# Patient Record
Sex: Female | Born: 1990 | Race: White | Hispanic: No | Marital: Married | Smoking: Heavy tobacco smoker
Health system: Southern US, Community
[De-identification: ages and names within clinical notes are randomized; demographics above are authoritative.]

## PROBLEM LIST (undated history)

## (undated) DIAGNOSIS — T7840XA Allergy, unspecified, initial encounter: Secondary | ICD-10-CM

## (undated) HISTORY — DX: Allergy, unspecified, initial encounter: T78.40XA

---
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
## 2011-01-14 LAB — BASIC METABOLIC PANEL
BUN: 13
Calcium: 9.6
Glucose, Bld: 96
Potassium: 3.6
Sodium: 138

## 2011-01-14 LAB — CBC
HCT: 41
Hemoglobin: 14
MCHC: 34.2
MCV: 84.4
WBC: 13.9 — ABNORMAL HIGH

## 2011-01-14 LAB — LIPASE, BLOOD: Lipase: 23

## 2011-01-14 LAB — DIFFERENTIAL
Basophils Relative: 1
Eosinophils Relative: 1
Monocytes Relative: 5

## 2011-12-14 ENCOUNTER — Other Ambulatory Visit (HOSPITAL_COMMUNITY)
Admission: RE | Admit: 2011-12-14 | Discharge: 2011-12-14 | Disposition: A | Discharge status: Home / Self Care | Payer: BC Managed Care – PPO | Source: Ambulatory Visit | Attending: Family Medicine | Admitting: Family Medicine

## 2011-12-14 ENCOUNTER — Other Ambulatory Visit: Payer: Self-pay | Admitting: Family Medicine

## 2011-12-14 DIAGNOSIS — Z01419 Encounter for gynecological examination (general) (routine) without abnormal findings: Secondary | ICD-10-CM | POA: Insufficient documentation

## 2013-10-09 ENCOUNTER — Other Ambulatory Visit: Payer: Self-pay | Admitting: Family Medicine

## 2013-10-09 ENCOUNTER — Other Ambulatory Visit (HOSPITAL_COMMUNITY)
Admission: RE | Admit: 2013-10-09 | Discharge: 2013-10-09 | Disposition: A | Discharge status: Home / Self Care | Payer: PRIVATE HEALTH INSURANCE | Source: Ambulatory Visit | Attending: Family Medicine | Admitting: Family Medicine

## 2013-10-09 DIAGNOSIS — Z0000 Encounter for general adult medical examination without abnormal findings: Secondary | ICD-10-CM | POA: Insufficient documentation

## 2013-10-09 DIAGNOSIS — Z113 Encounter for screening for infections with a predominantly sexual mode of transmission: Secondary | ICD-10-CM | POA: Insufficient documentation

## 2013-10-10 LAB — CYTOLOGY - PAP

## 2014-04-27 ENCOUNTER — Ambulatory Visit (INDEPENDENT_AMBULATORY_CARE_PROVIDER_SITE_OTHER): Payer: PRIVATE HEALTH INSURANCE | Admitting: Physician Assistant

## 2014-04-27 VITALS — BP 138/82 | HR 64 | Temp 98.1°F | Resp 18 | Ht 63.5 in | Wt 254.0 lb

## 2014-04-27 DIAGNOSIS — N898 Other specified noninflammatory disorders of vagina: Secondary | ICD-10-CM

## 2014-04-27 DIAGNOSIS — N3001 Acute cystitis with hematuria: Secondary | ICD-10-CM

## 2014-04-27 DIAGNOSIS — L298 Other pruritus: Secondary | ICD-10-CM

## 2014-04-27 DIAGNOSIS — R300 Dysuria: Secondary | ICD-10-CM

## 2014-04-27 LAB — POCT UA - MICROSCOPIC ONLY
CASTS, UR, LPF, POC: NEGATIVE
Crystals, Ur, HPF, POC: NEGATIVE
YEAST UA: NEGATIVE

## 2014-04-27 LAB — POCT WET PREP WITH KOH
Clue Cells Wet Prep HPF POC: NEGATIVE
KOH PREP POC: NEGATIVE
RBC WET PREP PER HPF POC: NEGATIVE
Trichomonas, UA: NEGATIVE
Yeast Wet Prep HPF POC: NEGATIVE

## 2014-04-27 LAB — POCT URINALYSIS DIPSTICK
Bilirubin, UA: NEGATIVE
Glucose, UA: NEGATIVE
KETONES UA: NEGATIVE
Nitrite, UA: NEGATIVE
PH UA: 5
PROTEIN UA: NEGATIVE
SPEC GRAV UA: 1.025
Urobilinogen, UA: 0.2

## 2014-04-27 LAB — POCT URINE PREGNANCY: Preg Test, Ur: NEGATIVE

## 2014-04-27 MED ORDER — PHENAZOPYRIDINE HCL 200 MG PO TABS: 200.0000 mg | ORAL_TABLET | Freq: Three times a day (TID) | ORAL | Status: DC | PRN

## 2014-04-27 MED ORDER — NITROFURANTOIN MONOHYD MACRO 100 MG PO CAPS: 100.0000 mg | ORAL_CAPSULE | Freq: Two times a day (BID) | ORAL | Status: AC

## 2014-04-27 NOTE — Patient Instructions (Signed)
Drink a lot of fluids to help get the antibiotic to the bladder to treat the infection.

## 2014-04-27 NOTE — Progress Notes (Signed)
Subjective:    Patient ID: Rhonda Cobb, female    DOB: 03/26/1991, 24 y.o.   MRN: [id number]  HPI Pt presents to clinic with about 2 weeks h/o dysuria and urinary frequecy and urgency.  She started taking AZO and thought it was getting better but then she starting to get vaginal itching so she used a monistat and after that she started to get urinary symptoms again.  She is sexually active with her female finance and recently stopped using condoms because they are thinking about pregnancy.  She was put on OCP for irregular menses and for about 4 months she had regular menses and then the started to get irregular again and after 2 months of break through bleeding in November she stopped taking them and they started trying to get pregnant  She is concerned because she has not had a positive pregnancy test yet and she has not had her period since November.    OTC - AZO - slight improvement, monistat 7 day - slight help with itching,     Review of Systems  Genitourinary: Positive for dysuria, urgency, frequency and hematuria. Negative for vaginal discharge and vaginal pain.   There are no active problems to display for this patient.  Prior to Admission medications   Medication Sig Start Date End Date Taking? Authorizing Provider  none   05/02/14  Morrell Riddle, PA-C       Morrell Riddle, PA-C   No Known Allergies  Medications, allergies, past medical history, surgical history, family history, social history and problem list reviewed and updated.      Objective:   Physical Exam  Constitutional: She is oriented to person, place, and time. She appears well-developed and well-nourished.  BP 138/82 mmHg  Pulse 64  Temp(Src) 98.1 F (36.7 C) (Oral)  Resp 18  Ht 5' 3.5" (1.613 m)  Wt 254 lb (115.214 kg)  BMI 44.28 kg/m2  SpO2 98%   HENT:  Head: Normocephalic and atraumatic.  Right Ear: External ear normal.  Left Ear: External ear normal.  Eyes: Conjunctivae are normal.  Neck: Normal  range of motion.  Cardiovascular: Normal rate, regular rhythm and normal heart sounds.   No murmur heard. Pulmonary/Chest: Effort normal and breath sounds normal. She has no wheezes.  Abdominal: Soft. Bowel sounds are normal. There is tenderness in the suprapubic area. There is no rebound, no guarding, no CVA tenderness and no tenderness at McBurney's point.  Genitourinary: Vagina normal and uterus normal. Pelvic exam was performed with patient supine. No labial fusion. There is no rash, tenderness, lesion or injury on the right labia. There is no rash, tenderness, lesion or injury on the left labia. Cervix exhibits no motion tenderness, no discharge and no friability. Right adnexum displays no mass, no tenderness and no fullness. Left adnexum displays no mass, no tenderness and no fullness.  Mild erythema interior aspect of labia minor.  Neurological: She is alert and oriented to person, place, and time.  Skin: Skin is warm and dry.  Psychiatric: She has a normal mood and affect. Her behavior is normal. Judgment and thought content normal.   Results for orders placed or performed in visit on 04/27/14  POCT urine pregnancy  Result Value Ref Range   Preg Test, Ur Negative   POCT urinalysis dipstick  Result Value Ref Range   Color, UA yellow    Clarity, UA cloudy    Glucose, UA neg    Bilirubin, UA neg  Ketones, UA neg    Spec Grav, UA 1.025    Blood, UA trace    pH, UA 5.0    Protein, UA neg    Urobilinogen, UA 0.2    Nitrite, UA neg    Leukocytes, UA moderate (2+)   POCT UA - Microscopic Only  Result Value Ref Range   WBC, Ur, HPF, POC 35-40    RBC, urine, microscopic 2-4    Bacteria, U Microscopic 1+    Mucus, UA trace    Epithelial cells, urine per micros 2-4    Crystals, Ur, HPF, POC neg    Casts, Ur, LPF, POC neg    Yeast, UA neg   POCT Wet Prep with KOH  Result Value Ref Range   Trichomonas, UA Negative    Clue Cells Wet Prep HPF POC neg    Epithelial Wet Prep HPF  POC 3-5    Yeast Wet Prep HPF POC neg    Bacteria Wet Prep HPF POC 1+    RBC Wet Prep HPF POC neg    WBC Wet Prep HPF POC 2-4    KOH Prep POC Negative         Assessment & Plan:  Dysuria - Plan: POCT urine pregnancy, POCT urinalysis dipstick, POCT UA - Microscopic Only, GC/Chlamydia Probe Amp, Urine culture  Vaginal itching - Plan: POCT Wet Prep with KOH  Acute cystitis with hematuria - Plan: phenazopyridine (PYRIDIUM) 200 MG tablet, nitrofurantoin, macrocrystal-monohydrate, (MACROBID) 100 MG capsule   Pt has UTI on lab results.  We are waiting on genprobe but I expect it to be negative.  I suspect she has PCOS and she will find out who her friends gyn is and make an appt there - it is time for her pap smear (she has not had a pap smear since turning 24 y/o) and she will further work-up for PCOS and due to her interest in pregnancy she should start her care at a OB/GYN office.  Benny Lennert PA-C  [facility] 04/27/2014 3:56 PM

## 2014-04-30 LAB — GC/CHLAMYDIA PROBE AMP
CT Probe RNA: NEGATIVE
GC Probe RNA: NEGATIVE

## 2014-04-30 LAB — URINE CULTURE

## 2014-05-01 ENCOUNTER — Telehealth: Payer: Self-pay | Admitting: Physician Assistant

## 2014-05-01 NOTE — Telephone Encounter (Signed)
Attempted to call pt as well; left message to call back.

## 2014-05-01 NOTE — Telephone Encounter (Signed)
I called and spoke with Dr Orvan Falconerampbell of ID and his recommendation regarding resistance E coli - was the following.  If she is better on the Macrobid nothing further needs to occur because at that point she would be considered asymptomatic.  If she is still having symptoms we will use Fosifimycin 3g single dose.

## 2014-05-01 NOTE — Telephone Encounter (Signed)
I called and left a message for the patient to return the phone call to discuss her results.  I need to know if the patients urine symptoms are gone.  If they are not I need to send in a different abx if they are gone - great.

## 2014-05-05 NOTE — Telephone Encounter (Signed)
Lm to rtn call to advise how she is doing.

## 2014-05-06 NOTE — Telephone Encounter (Signed)
Lm for pt- please rtn call if not feeling any better.

## 2014-05-10 ENCOUNTER — Telehealth: Payer: Self-pay

## 2014-05-10 NOTE — Telephone Encounter (Signed)
Patient says she is feeling better

## 2014-05-17 ENCOUNTER — Telehealth: Payer: Self-pay | Admitting: Family Medicine

## 2014-05-17 DIAGNOSIS — N3000 Acute cystitis without hematuria: Secondary | ICD-10-CM

## 2014-05-17 MED ORDER — FOSFOMYCIN TROMETHAMINE 3 G PO PACK: 3.0000 g | PACK | Freq: Once | ORAL | Status: DC

## 2014-05-17 NOTE — Telephone Encounter (Signed)
Received a call from the answering service that "her UTI came back."  It appears that on 04/27/14 she had a multidrug resistant UTI.  Fosfomycin thought to be our best option.  Was able to find an rx and an open drug store- [facility] [address].    Called pt- she notes urinary frequency.  No fever, abd pain or vomiting. LMP was in November- she reports that she recently came off of OCP.  Negative HCG at her last visit. Advised her to take a home HCG again.  If positive result medication probably still ok but call me prior to taking it.   [facility] if she can get there safely and get rx.  She will let us know if sx persist

## 2014-06-24 ENCOUNTER — Encounter (HOSPITAL_COMMUNITY): Payer: Self-pay

## 2014-06-24 ENCOUNTER — Emergency Department (HOSPITAL_COMMUNITY)
Admission: EM | Admit: 2014-06-24 | Discharge: 2014-06-25 | Disposition: A | Discharge status: Home / Self Care | Payer: PRIVATE HEALTH INSURANCE | Attending: Emergency Medicine | Admitting: Emergency Medicine

## 2014-06-24 ENCOUNTER — Emergency Department (HOSPITAL_COMMUNITY): Payer: PRIVATE HEALTH INSURANCE

## 2014-06-24 DIAGNOSIS — Y9389 Activity, other specified: Secondary | ICD-10-CM | POA: Diagnosis not present

## 2014-06-24 DIAGNOSIS — W010XXA Fall on same level from slipping, tripping and stumbling without subsequent striking against object, initial encounter: Secondary | ICD-10-CM | POA: Insufficient documentation

## 2014-06-24 DIAGNOSIS — Y998 Other external cause status: Secondary | ICD-10-CM | POA: Insufficient documentation

## 2014-06-24 DIAGNOSIS — Z720 Tobacco use: Secondary | ICD-10-CM | POA: Insufficient documentation

## 2014-06-24 DIAGNOSIS — S82431A Displaced oblique fracture of shaft of right fibula, initial encounter for closed fracture: Secondary | ICD-10-CM | POA: Diagnosis not present

## 2014-06-24 DIAGNOSIS — Y9289 Other specified places as the place of occurrence of the external cause: Secondary | ICD-10-CM | POA: Diagnosis not present

## 2014-06-24 DIAGNOSIS — S82401A Unspecified fracture of shaft of right fibula, initial encounter for closed fracture: Secondary | ICD-10-CM

## 2014-06-24 DIAGNOSIS — S99911A Unspecified injury of right ankle, initial encounter: Secondary | ICD-10-CM | POA: Diagnosis present

## 2014-06-24 MED ORDER — OXYCODONE-ACETAMINOPHEN 5-325 MG PO TABS
1.0000 | ORAL_TABLET | Freq: Once | ORAL | Status: AC
  Administered 2014-06-24: 1 via ORAL
  Filled 2014-06-24: qty 1

## 2014-06-24 MED ORDER — IBUPROFEN 800 MG PO TABS
800.0000 mg | ORAL_TABLET | Freq: Once | ORAL | Status: AC
  Administered 2014-06-24: 800 mg via ORAL
  Filled 2014-06-24: qty 1

## 2014-06-24 NOTE — ED Notes (Signed)
Pt states she "tripped over a pot hole", states she heard "pops and cracks". Landed on butt, denies hitting head, denies LOC. Pulse 3, warm, sensory intact.

## 2014-06-24 NOTE — ED Provider Notes (Signed)
CSN: [id number]     Arrival date & time 06/24/14  2246 History  This chart was scribed for Joya Gaskins, MD by Richarda Overlie, ED Scribe. This patient was seen in room APA12/APA12 and the patient's care was started 11:29 PM.  Chief Complaint  Patient presents with  . Ankle Pain   Patient is a 24 y.o. female presenting with ankle pain. The history is provided by the patient. No language interpreter was used.  Ankle Pain Location:  Ankle Time since incident:  2 hours Injury: yes   Mechanism of injury: fall   Fall:    Fall occurred:  Tripped Ankle location:  R ankle Pain details:    Severity:  Moderate   Timing:  Constant Chronicity:  New  HPI Comments: Rhonda Cobb is a 24 y.o. female who presents to the Emergency Department complaining of right ankle pain that started about an hour ago after pt tripped over a pot hole. She states she heard "pops and cracks" when she fell. Pt states that she landed on her butt and denies hitting her head or experiencing a LOC. She states that she rolled her right ankle in January. Pt reports no pertinent past medical history. She reports no modifying factors at this time. Pt reports NKDA.   Past Medical History  Diagnosis Date  . Allergy    History reviewed. No pertinent past surgical history. History reviewed. No pertinent family history. History  Substance Use Topics  . Smoking status: Heavy Tobacco Smoker -- 0.50 packs/day    Types: Cigarettes  . Smokeless tobacco: Former Neurosurgeon  . Alcohol Use: 0.0 oz/week    0 Standard drinks or equivalent per week     Comment: occasional    OB History    No data available     Review of Systems  Musculoskeletal: Positive for arthralgias.  All other systems reviewed and are negative.   Allergies  Review of patient's allergies indicates no known allergies.  Home Medications   Prior to Admission medications   Medication Sig Start Date End Date Taking? Authorizing Provider  fosfomycin  (MONUROL) 3 G PACK Take 3 g by mouth once. 05/17/14   Pearline Cables, MD  phenazopyridine (PYRIDIUM) 200 MG tablet Take 1 tablet (200 mg total) by mouth 3 (three) times daily as needed for pain. 04/27/14   Morrell Riddle, PA-C   BP 149/105 mmHg  Pulse 92  Temp(Src) 98.4 F (36.9 C) (Oral)  Resp 18  Ht  (1.6 m)  Wt 250 lb (113.399 kg)  BMI 44.30 kg/m2  SpO2 100%  LMP 01/23/2014 Physical Exam CONSTITUTIONAL: Well developed/well nourished HEAD: Normocephalic/atraumatic EYES: EOMI/PERRL ENMT: Mucous membranes moist NECK: supple no meningeal signs SPINE/BACK:entire spine nontender CV: S1/S2 noted, no murmurs/rubs/gallops noted LUNGS: Lungs are clear to auscultation bilaterally, no apparent distress ABDOMEN: soft, nontender, no rebound or guarding, bowel sounds noted throughout abdomen GU:no cva tenderness NEURO: Pt is awake/alert/appropriate, moves all extremitiesx4.  No facial droop.   EXTREMITIES: pulses normal/equal, full ROM. Tenderness to right lateral malleolus. No foot tenderness noted. No proximal fibula tenderness. No lacerations.  SKIN: warm, color normal PSYCH: no abnormalities of mood noted, alert and oriented to situation  ED Course  Procedures    SPLINT APPLICATION Date/Time: 06/25/14 Authorized by: Joya Gaskins Consent: Verbal consent obtained. Risks and benefits: risks, benefits and alternatives were discussed Consent given by: patient Splint applied by: orthopedic technician Location details: right ankle Supplies used: fiberglass Posterior/stirrup splint Post-procedure: The splinted body part  was neurovascularly unchanged following the procedure. Patient tolerance: Patient tolerated the procedure well with no immediate complications.    DIAGNOSTIC STUDIES: Oxygen Saturation is 100% on RA, normal by my interpretation.    COORDINATION OF CARE: 11:35 PM Discussed treatment plan with pt at bedside and pt agreed to plan. 1:03 AM Pt with closed  distal fibular fx She is neurovascularly intact [place] in [place] and prefers to f/u there Referral info given and advised to call tomorrow   Imaging Review Dg Ankle Complete Right  06/25/2014   CLINICAL DATA:  Injury to right ankle when stepping in hole. Severe right foot and right ankle pain and swelling. Initial encounter.  EXAM: RIGHT ANKLE - COMPLETE 3+ VIEW  COMPARISON:  None.  FINDINGS: There is a displaced oblique fracture through the distal fibular diaphysis. There appears to be widening of the interosseous space. The fracture demonstrates medial and posterior displacement of the distal fragment. There is mild medial widening of the ankle mortise. No additional fractures are seen. An apparent os trigonum is noted.  The subtalar joint is grossly unremarkable in appearance. Diffuse soft tissue swelling is noted about the ankle.  IMPRESSION: 1. Displaced oblique fracture through the distal fibular diaphysis. Apparent widening of the interosseous space, with medial and posterior displacement of the distal fibular fragment, and mild medial widening of the ankle mortise. 2. Apparent os trigonum noted.   Electronically Signed   By: Roanna RaiderJeffery  Chang M.D.   On: 06/25/2014 00:40   Dg Foot Complete Right  06/25/2014   CLINICAL DATA:  Injury to right ankle when stepping in hole. Fell. Severe right foot pain and swelling. Initial encounter.  EXAM: RIGHT FOOT COMPLETE - 3+ VIEW  COMPARISON:  None.  FINDINGS: The distal fibular fracture is better characterized on concurrent ankle radiographs, with mild medial widening of ankle mortise.  There is no additional evidence of fracture. The joint spaces are preserved. There is no evidence of talar subluxation; the subtalar joint is unremarkable in appearance. An os trigonum is noted.  Diffuse soft tissue swelling is noted about the ankle.  IMPRESSION: 1. Distal fibular fracture is better characterized on concurrent ankle radiographs, with mild medial widening of the ankle  mortise. 2. Os trigonum noted.   Electronically Signed   By: Roanna RaiderJeffery  Chang M.D.   On: 06/25/2014 00:42    Medications  oxyCODONE-acetaminophen (PERCOCET/ROXICET) 5-325 MG per tablet 1 tablet (1 tablet Oral Given 06/24/14 2340)  ibuprofen (ADVIL,MOTRIN) tablet 800 mg (800 mg Oral Given 06/24/14 2340)     MDM   Final diagnoses:  Closed fibular fracture, right, initial encounter    xrays/imaging reviewed by myself and considered during evaluation Nursing notes including past medical history and social history reviewed and considered in documentation   I personally performed the services described in this documentation, which was scribed in my presence. The recorded information has been reviewed and is accurate.       Joya Gaskinsonald W Ngoc Detjen, MD 06/25/14 [phone]

## 2014-06-25 MED ORDER — IBUPROFEN 600 MG PO TABS: 600.0000 mg | ORAL_TABLET | Freq: Four times a day (QID) | ORAL | Status: DC | PRN

## 2014-06-25 MED ORDER — OXYCODONE-ACETAMINOPHEN 5-325 MG PO TABS: 1.0000 | ORAL_TABLET | ORAL | Status: DC | PRN

## 2014-12-12 ENCOUNTER — Other Ambulatory Visit: Payer: Self-pay | Admitting: Obstetrics and Gynecology

## 2014-12-16 LAB — CYTOLOGY - PAP

## 2015-08-18 ENCOUNTER — Other Ambulatory Visit: Payer: Self-pay | Admitting: Family Medicine

## 2015-08-18 DIAGNOSIS — E282 Polycystic ovarian syndrome: Secondary | ICD-10-CM

## 2015-08-21 ENCOUNTER — Other Ambulatory Visit: Payer: PRIVATE HEALTH INSURANCE

## 2015-09-02 ENCOUNTER — Other Ambulatory Visit: Payer: PRIVATE HEALTH INSURANCE

## 2015-09-04 ENCOUNTER — Ambulatory Visit
Admission: RE | Admit: 2015-09-04 | Discharge: 2015-09-04 | Disposition: A | Discharge status: Home / Self Care | Payer: PRIVATE HEALTH INSURANCE | Source: Ambulatory Visit | Attending: Family Medicine | Admitting: Family Medicine

## 2015-09-04 DIAGNOSIS — E282 Polycystic ovarian syndrome: Secondary | ICD-10-CM

## 2020-07-02 ENCOUNTER — Ambulatory Visit
Admission: EM | Admit: 2020-07-02 | Discharge: 2020-07-02 | Disposition: A | Discharge status: Home / Self Care | Payer: 59 | Attending: Emergency Medicine | Admitting: Emergency Medicine

## 2020-07-02 ENCOUNTER — Other Ambulatory Visit: Payer: Self-pay

## 2020-07-02 ENCOUNTER — Ambulatory Visit (INDEPENDENT_AMBULATORY_CARE_PROVIDER_SITE_OTHER): Payer: 59

## 2020-07-02 DIAGNOSIS — M79672 Pain in left foot: Secondary | ICD-10-CM

## 2020-07-02 DIAGNOSIS — S93602A Unspecified sprain of left foot, initial encounter: Secondary | ICD-10-CM

## 2020-07-02 NOTE — Discharge Instructions (Addendum)
RICE: Rest Ice for 10-15 minutes every 4-6 hours as needed for pain and swelling Compression (ace bandage) Elevation   Take ibuprofen as needed for pain.   Return or go to the Emergency Department if symptoms worsen or do not improve in the next few days.

## 2020-07-02 NOTE — ED Triage Notes (Signed)
Pt c/o top of lt foot pain since mondays. Denies injury. States pain worse moving foot upward.

## 2020-07-02 NOTE — ED Provider Notes (Signed)
Rhonda Cobb   [id number] 07/02/20 Arrival Time: 1606  EN:IDPOE PAIN  SUBJECTIVE: History from: patient. Rhonda Cobb is a 30 y.o. female complains of left foot pain that began 3 days ago.  Denies a precipitating event or specific injury.  Localizes the pain to the top and side of left foot.  Describes the pain as constant and achy in character.  Has tried OTC medications without relief.  Symptoms are made worse with activity.  Denies similar symptoms in the past.  Denies fever, chills, erythema, ecchymosis, effusion, weakness, numbness and tingling, saddle paresthesias, loss of bowel or bladder function.      ROS: As per HPI.  All other pertinent ROS negative.     Past Medical History:  Diagnosis Date  . Allergy    History reviewed. No pertinent surgical history. No Known Allergies No current facility-administered medications on file prior to encounter.   No current outpatient medications on file prior to encounter.   Social History   Socioeconomic History  . Marital status: Married    Spouse name: Not on file  . Number of children: Not on file  . Years of education: Not on file  . Highest education level: Not on file  Occupational History  . Not on file  Tobacco Use  . Smoking status: Heavy Tobacco Smoker    Packs/day: 0.50    Types: Cigarettes  . Smokeless tobacco: Former Engineer, water and Sexual Activity  . Alcohol use: Yes    Alcohol/week: 0.0 standard drinks    Comment: occasional   . Drug use: No  . Sexual activity: Yes  Other Topics Concern  . Not on file  Social History Narrative  . Not on file   Social Determinants of Health   Financial Resource Strain: Not on file  Food Insecurity: Not on file  Transportation Needs: Not on file  Physical Activity: Not on file  Stress: Not on file  Social Connections: Not on file  Intimate Partner Violence: Not on file   History reviewed. No pertinent family history.  OBJECTIVE:  Vitals:    07/02/20 1705  BP: 139/84  Pulse: 75  Resp: 18  Temp: 98.4 F (36.9 C)  TempSrc: Oral  SpO2: 98%    General appearance: ALERT; in no acute distress.  Head: NCAT Lungs: Normal respiratory effort CV: pulses 2+ bilaterally. Cap refill < 2 seconds Musculoskeletal:  Inspection: Skin warm, dry, clear and intact No erythema or effusion Palpation: top of left foot tender to palpation ROM: Full ROM active and passive to left foot Skin: warm and dry Neurologic: Ambulates without difficulty; Sensation intact about the upper/ lower extremities Psychological: alert and cooperative; normal mood and affect  DIAGNOSTIC STUDIES:  DG Foot Complete Left  Result Date: 07/02/2020 CLINICAL DATA:  Left foot pain x4 days. No known injury. Pain is located on the top of the foot EXAM: LEFT FOOT - COMPLETE 3+ VIEW COMPARISON:  None. FINDINGS: There is no evidence of fracture or dislocation. There is no evidence of arthropathy or other focal bone abnormality. Soft tissues are unremarkable. There is a small plantar calcaneal spur. IMPRESSION: Negative. Electronically Signed   By: Katherine Mantle M.D.   On: 07/02/2020 18:36     ASSESSMENT & PLAN:  1. Left foot pain   2. Foot sprain, left, initial encounter      No orders of the defined types were placed in this encounter.   Continue conservative management of rest, ice, and gentle stretches Take ibuprofen  as needed for pain relief (may cause abdominal discomfort, ulcers, and GI bleeds avoid taking with other NSAIDs) Follow up with PCP if symptoms persist Return or go to the ER if you have any new or worsening symptoms (fever, chills, chest pain, abdominal pain, changes in bowel or bladder habits, pain radiating into lower legs)   Reviewed expectations re: course of current medical issues. Questions answered. Outlined signs and symptoms indicating need for more acute intervention. Patient verbalized understanding. After Visit Summary  given.       Ivette Loyal, NP 07/02/20 1901

## 2022-09-19 IMAGING — DX DG FOOT COMPLETE 3+V*L*
3 series · 3 of 3 positions shown · non-contrast
Comparison: None.

CLINICAL DATA: Left foot pain x4 days. No known injury. Pain is
located on the top of the foot

EXAM:
LEFT FOOT - COMPLETE 3+ VIEW

[foot supine dp]
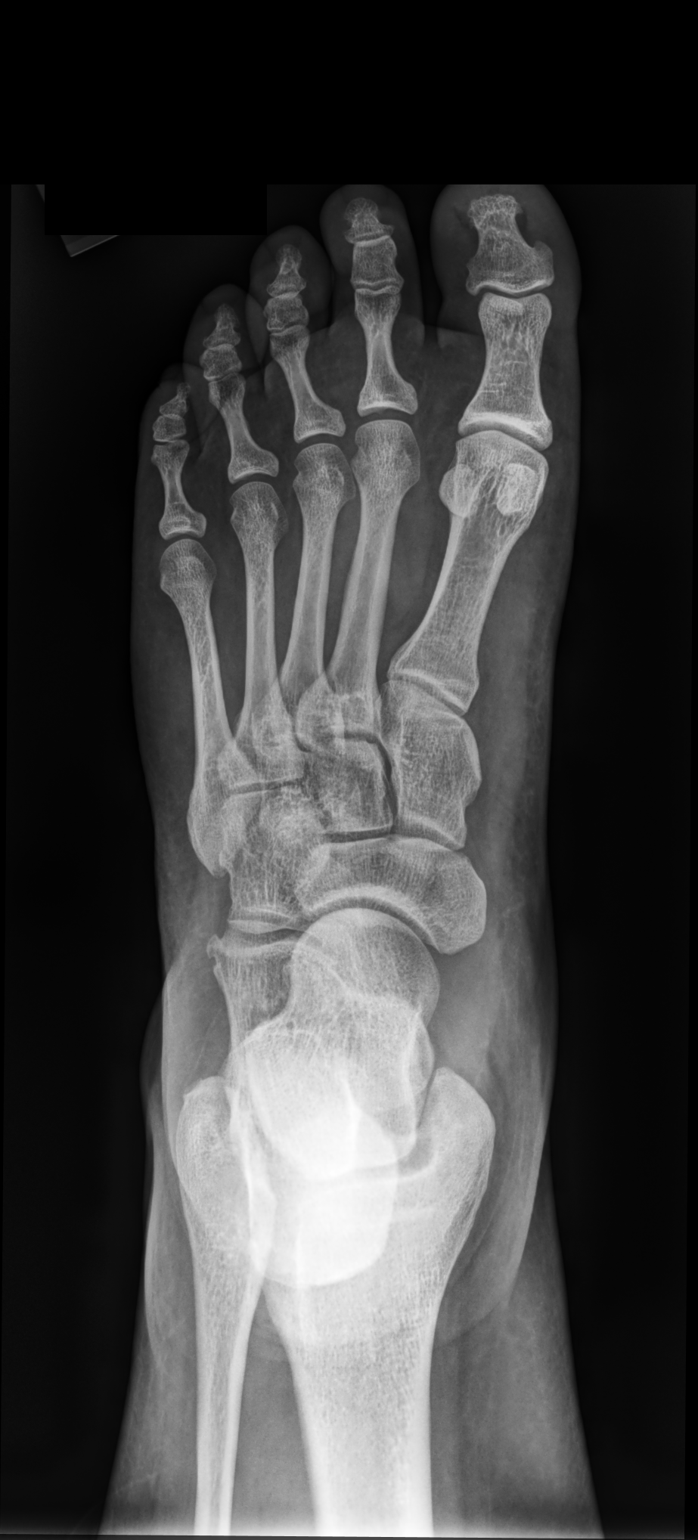

[foot medial oblique]
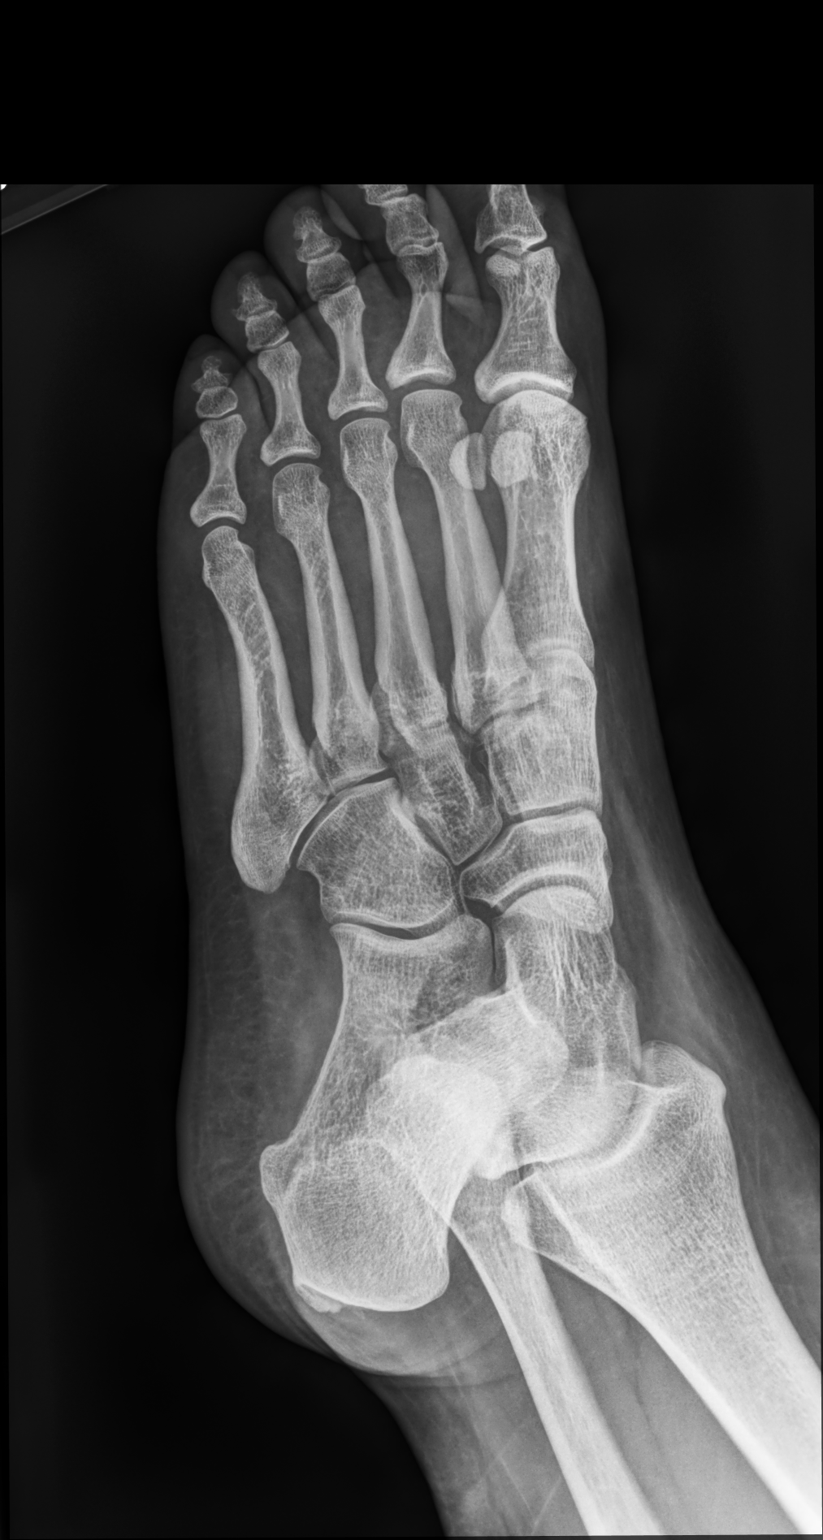

[foot supine lat]
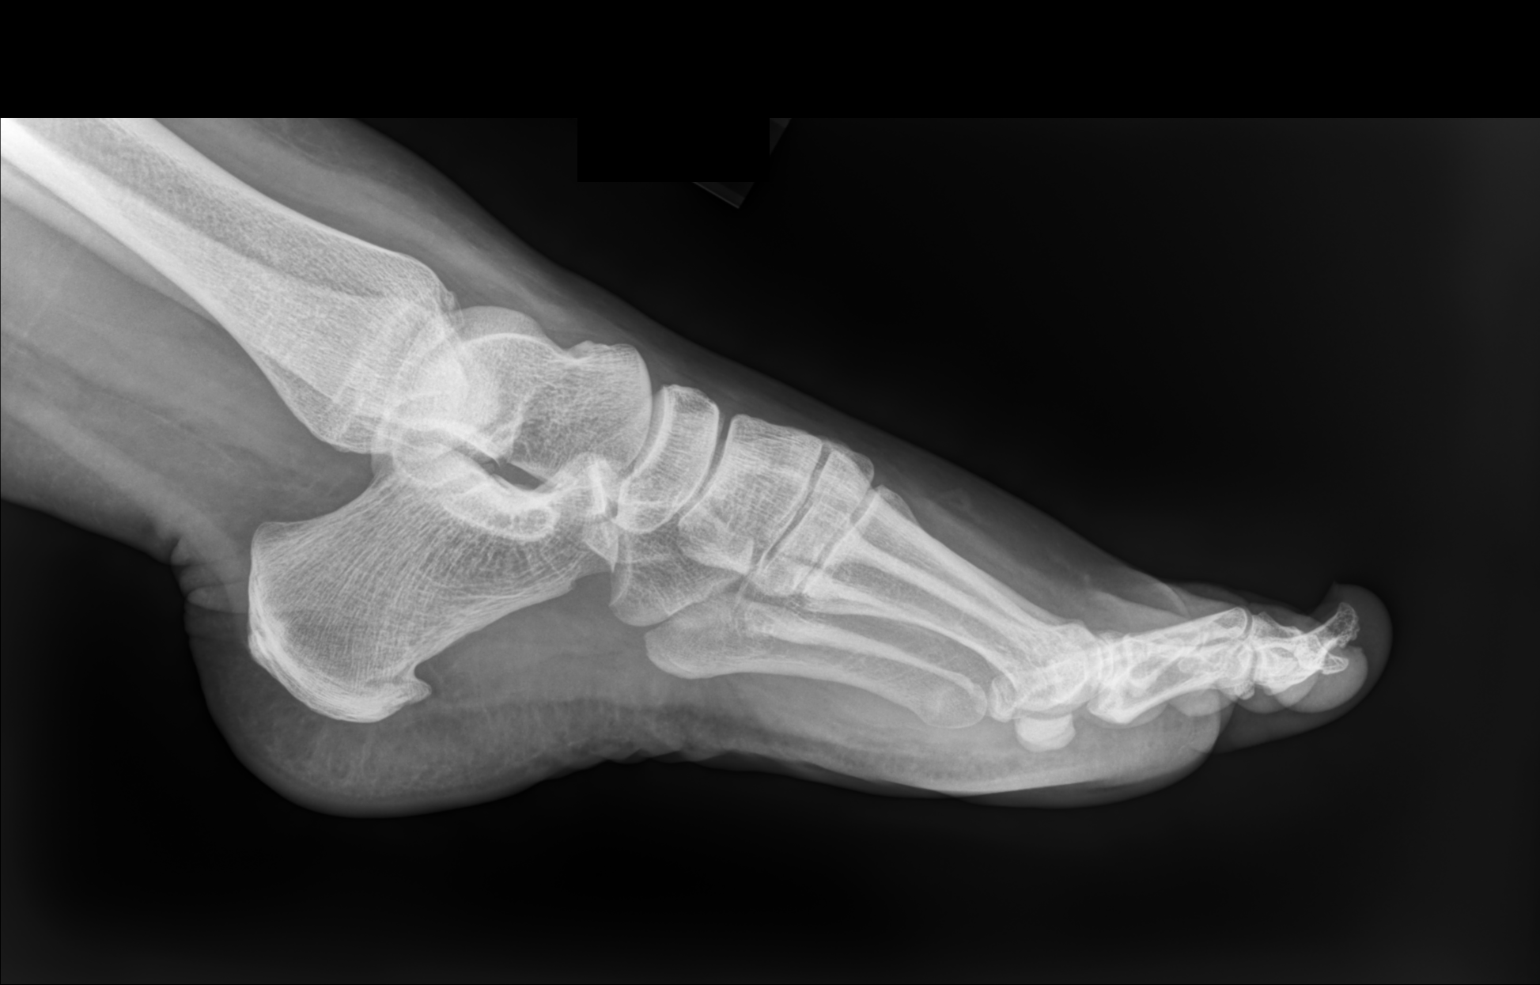

[3 of 3 positions shown; findings below may reference images not displayed]

FINDINGS: There is no evidence of fracture or dislocation. There is no
evidence of arthropathy or other focal bone abnormality. Soft
tissues are unremarkable. There is a small plantar calcaneal spur.
IMPRESSION: Negative.

## 2024-03-20 ENCOUNTER — Emergency Department (HOSPITAL_COMMUNITY)
Admission: EM | Admit: 2024-03-20 | Discharge: 2024-03-20 | Disposition: A | Discharge status: Home / Self Care | Attending: Emergency Medicine | Admitting: Emergency Medicine

## 2024-03-20 ENCOUNTER — Other Ambulatory Visit: Payer: Self-pay

## 2024-03-20 ENCOUNTER — Encounter (HOSPITAL_COMMUNITY): Payer: Self-pay | Admitting: Emergency Medicine

## 2024-03-20 ENCOUNTER — Emergency Department (HOSPITAL_COMMUNITY)

## 2024-03-20 DIAGNOSIS — R1032 Left lower quadrant pain: Secondary | ICD-10-CM | POA: Diagnosis present

## 2024-03-20 DIAGNOSIS — K5792 Diverticulitis of intestine, part unspecified, without perforation or abscess without bleeding: Secondary | ICD-10-CM

## 2024-03-20 DIAGNOSIS — K5732 Diverticulitis of large intestine without perforation or abscess without bleeding: Secondary | ICD-10-CM | POA: Insufficient documentation

## 2024-03-20 DIAGNOSIS — D72829 Elevated white blood cell count, unspecified: Secondary | ICD-10-CM | POA: Insufficient documentation

## 2024-03-20 LAB — URINALYSIS, ROUTINE W REFLEX MICROSCOPIC
Bilirubin Urine: NEGATIVE
Glucose, UA: NEGATIVE mg/dL
Hgb urine dipstick: NEGATIVE
Ketones, ur: NEGATIVE mg/dL
Nitrite: POSITIVE — AB
Protein, ur: 30 mg/dL — AB
Specific Gravity, Urine: 1.024 (ref 1.005–1.030)
pH: 5 (ref 5.0–8.0)

## 2024-03-20 LAB — COMPREHENSIVE METABOLIC PANEL WITH GFR
ALT: 16 U/L (ref 0–44)
AST: 16 U/L (ref 15–41)
Albumin: 3.8 g/dL (ref 3.5–5.0)
Alkaline Phosphatase: 85 U/L (ref 38–126)
Anion gap: 11 (ref 5–15)
BUN: 5 mg/dL — ABNORMAL LOW (ref 6–20)
CO2: 19 mmol/L — ABNORMAL LOW (ref 22–32)
Calcium: 8.8 mg/dL — ABNORMAL LOW (ref 8.9–10.3)
Chloride: 106 mmol/L (ref 98–111)
Creatinine, Ser: 0.71 mg/dL (ref 0.44–1.00)
GFR, Estimated: >60 mL/min (ref >60)
Glucose, Bld: 111 mg/dL — ABNORMAL HIGH (ref 70–99)
Potassium: 3.7 mmol/L (ref 3.5–5.1)
Sodium: 136 mmol/L (ref 135–145)
Total Bilirubin: 0.9 mg/dL (ref 0.0–1.2)
Total Protein: 7.4 g/dL (ref 6.5–8.1)

## 2024-03-20 LAB — CBC
HCT: 45.4 % (ref 36.0–46.0)
Hemoglobin: 14.2 g/dL (ref 12.0–15.0)
MCH: 26.7 pg (ref 26.0–34.0)
MCHC: 31.3 g/dL (ref 30.0–36.0)
MCV: 85.3 fL (ref 80.0–100.0)
Platelets: 413 K/uL — ABNORMAL HIGH (ref 150–400)
RBC: 5.32 MIL/uL — ABNORMAL HIGH (ref 3.87–5.11)
RDW: 14.2 % (ref 11.5–15.5)
WBC: 18.8 K/uL — ABNORMAL HIGH (ref 4.0–10.5)
nRBC: 0 % (ref 0.0–0.2)

## 2024-03-20 LAB — HCG, SERUM, QUALITATIVE: Preg, Serum: NEGATIVE

## 2024-03-20 LAB — LIPASE, BLOOD: Lipase: 22 U/L (ref 11–51)

## 2024-03-20 LAB — I-STAT CG4 LACTIC ACID, ED
Lactic Acid, Venous: 0.7 mmol/L (ref 0.5–1.9)
Lactic Acid, Venous: 0.7 mmol/L (ref 0.5–1.9)

## 2024-03-20 MED ORDER — AMOXICILLIN-POT CLAVULANATE 875-125 MG PO TABS: 1.0000 | ORAL_TABLET | Freq: Two times a day (BID) | ORAL | 0 refills | Status: AC

## 2024-03-20 MED ORDER — IOHEXOL 350 MG/ML SOLN
75.0000 mL | Freq: Once | INTRAVENOUS | Status: AC | PRN
  Administered 2024-03-20: 75 mL via INTRAVENOUS

## 2024-03-20 MED ORDER — ONDANSETRON HCL 4 MG/2ML IJ SOLN
4.0000 mg | Freq: Once | INTRAMUSCULAR | Status: AC
  Administered 2024-03-20: 4 mg via INTRAVENOUS
  Filled 2024-03-20: qty 2

## 2024-03-20 MED ORDER — OXYCODONE HCL 5 MG PO TABS: 5.0000 mg | ORAL_TABLET | Freq: Four times a day (QID) | ORAL | 0 refills | Status: AC | PRN

## 2024-03-20 MED ORDER — HYDROMORPHONE HCL 1 MG/ML IJ SOLN
0.5000 mg | Freq: Once | INTRAMUSCULAR | Status: AC
  Administered 2024-03-20: 0.5 mg via INTRAVENOUS
  Filled 2024-03-20: qty 1

## 2024-03-20 MED ORDER — AMOXICILLIN-POT CLAVULANATE 875-125 MG PO TABS
1.0000 | ORAL_TABLET | Freq: Once | ORAL | Status: AC
Start: 2024-03-20 — End: 2024-03-20
  Administered 2024-03-20: 1 via ORAL
  Filled 2024-03-20: qty 1

## 2024-03-20 MED ORDER — OXYCODONE HCL 5 MG PO TABS
5.0000 mg | ORAL_TABLET | Freq: Once | ORAL | Status: AC
  Administered 2024-03-20: 5 mg via ORAL
  Filled 2024-03-20: qty 1

## 2024-03-20 MED ORDER — HYDROMORPHONE HCL 1 MG/ML IJ SOLN
0.5000 mg | Freq: Once | INTRAMUSCULAR | Status: AC
Start: 2024-03-20 — End: 2024-03-20
  Administered 2024-03-20: 0.5 mg via INTRAVENOUS
  Filled 2024-03-20: qty 1

## 2024-03-20 MED ORDER — ACETAMINOPHEN 325 MG PO TABS
650.0000 mg | ORAL_TABLET | Freq: Once | ORAL | Status: AC | PRN
Start: 2024-03-20 — End: 2024-03-20
  Administered 2024-03-20: 650 mg via ORAL
  Filled 2024-03-20: qty 2

## 2024-03-20 MED ORDER — LACTATED RINGERS IV BOLUS
1000.0000 mL | Freq: Once | INTRAVENOUS | Status: AC
  Administered 2024-03-20: 1000 mL via INTRAVENOUS

## 2024-03-20 NOTE — ED Provider Notes (Signed)
 [place] EMERGENCY DEPARTMENT AT [facility] Provider Note   CSN: [id number] Arrival date & time: 03/20/24  1537     Patient presents with: Abdominal Pain   Rhonda Cobb is a 33 y.o. female.   Patient presents to the emergency department today for evaluation of left-sided abdominal pain.  She denies history of abdominal surgeries but has a history of PCOS and did have pain about 14 years ago from a cyst.  She states that yesterday morning she had a bowel movement and had to strain a bit.  Did not have immediate pain afterwards.  During the course of the day she developed pain in the left side of the abdomen.  When she tries to have a bowel movement now the pain radiates down to her left lower abdomen and groin.  She describes the pain as a menstrual cramp but denies vaginal bleeding or discharge.  No vomiting.  No fevers but temperature was 100.0 F on arrival.       Prior to Admission medications   Not on File    Allergies: Patient has no known allergies.    Review of Systems  Updated Vital Signs BP (!) 150/94   Pulse (!) 136   Temp 100 F (37.8 C) (Oral)   Resp 20   Ht 5' 3 (1.6 m)   Wt 113 kg   SpO2 100%   BMI 44.13 kg/m   Physical Exam Vitals and nursing note reviewed.  Constitutional:      General: She is not in acute distress.    Appearance: She is well-developed.  HENT:     Head: Normocephalic and atraumatic.     Right Ear: External ear normal.     Left Ear: External ear normal.     Nose: Nose normal.  Eyes:     Conjunctiva/sclera: Conjunctivae normal.  Cardiovascular:     Rate and Rhythm: Regular rhythm. Tachycardia present.     Heart sounds: No murmur heard. Pulmonary:     Effort: No respiratory distress.     Breath sounds: No wheezing, rhonchi or rales.  Abdominal:     Palpations: Abdomen is soft.     Tenderness: There is abdominal tenderness in the periumbilical area, suprapubic area, left upper quadrant and left lower  quadrant. There is no guarding or rebound. Negative signs include Murphy's sign and McBurney's sign.     Comments: Patient with moderate tenderness to the left lateral abdomen, not focused just on the pelvis.  Musculoskeletal:     Cervical back: Normal range of motion and neck supple.     Right lower leg: No edema.     Left lower leg: No edema.  Skin:    General: Skin is warm and dry.     Findings: No rash.  Neurological:     General: No focal deficit present.     Mental Status: She is alert. Mental status is at baseline.     Motor: No weakness.  Psychiatric:        Mood and Affect: Mood normal.     (all labs ordered are listed, but only abnormal results are displayed) Labs Reviewed  COMPREHENSIVE METABOLIC PANEL WITH GFR - Abnormal; Notable for the following components:      Result Value   CO2 19 (*)    Glucose, Bld 111 (*)    BUN 5 (*)    Calcium 8.8 (*)    All other components within normal limits  CBC - Abnormal; Notable for the  following components:   WBC 18.8 (*)    RBC 5.32 (*)    Platelets 413 (*)    All other components within normal limits  URINALYSIS, ROUTINE W REFLEX MICROSCOPIC - Abnormal; Notable for the following components:   Color, Urine AMBER (*)    APPearance CLOUDY (*)    Protein, ur 30 (*)    Nitrite POSITIVE (*)    Leukocytes,Ua TRACE (*)    Bacteria, UA MANY (*)    All other components within normal limits  LIPASE, BLOOD  HCG, SERUM, QUALITATIVE  I-STAT CG4 LACTIC ACID, ED  I-STAT CG4 LACTIC ACID, ED    EKG: None  Radiology: CT ABDOMEN PELVIS W CONTRAST Result Date: 03/20/2024 EXAM: CT ABDOMEN AND PELVIS WITH CONTRAST 03/20/2024 06:33:14 PM TECHNIQUE: CT of the abdomen and pelvis was performed with the administration of intravenous contrast. 75 mL of iohexol  (OMNIPAQUE ) 350 MG/ML injection was administered. Multiplanar reformatted images are provided for review. Automated exposure control, iterative reconstruction, and/or weight-based  adjustment of the mA/kV was utilized to reduce the radiation dose to as low as reasonably achievable. COMPARISON: 10/19/2007 CLINICAL HISTORY: Abdominal pain, acute, nonlocalized; left sided abd pain. FINDINGS: LOWER CHEST: No acute abnormality. LIVER: The liver is unremarkable. GALLBLADDER AND BILE DUCTS: Gallbladder is unremarkable. No biliary ductal dilatation. SPLEEN: No acute abnormality. PANCREAS: No acute abnormality. ADRENAL GLANDS: No acute abnormality. KIDNEYS, URETERS AND BLADDER: No stones in the kidneys or ureters. No hydronephrosis. No perinephric or periureteral stranding. Urinary bladder is unremarkable. GI AND BOWEL: Stomach demonstrates no acute abnormality. Few scattered sigmoid diverticula. Inflammatory stranding around the mid sigmoid colon compatible with active diverticulitis. There is no bowel obstruction. PERITONEUM AND RETROPERITONEUM: No ascites. No free air. VASCULATURE: Aorta is normal in caliber. LYMPH NODES: No lymphadenopathy. REPRODUCTIVE ORGANS: No acute abnormality. BONES AND SOFT TISSUES: No acute osseous abnormality. No focal soft tissue abnormality. IMPRESSION: 1. Sigmoid diverticulosis with changes of active diverticulitis in the mid sigmoid colon. Electronically signed by: Franky Crease MD 03/20/2024 07:16 PM EST RP Workstation: [id number]     Procedures   Medications Ordered in the ED  HYDROmorphone  (DILAUDID ) injection 0.5 mg (has no administration in time range)  oxyCODONE  (Oxy IR/ROXICODONE ) immediate release tablet 5 mg (has no administration in time range)  amoxicillin -clavulanate (AUGMENTIN ) 875-125 MG per tablet 1 tablet (has no administration in time range)  acetaminophen  (TYLENOL ) tablet 650 mg (650 mg Oral Given 03/20/24 1551)  lactated ringers  bolus 1,000 mL (1,000 mLs Intravenous New Bag/Given 03/20/24 1809)  HYDROmorphone  (DILAUDID ) injection 0.5 mg (0.5 mg Intravenous Given 03/20/24 1812)  ondansetron  (ZOFRAN ) injection 4 mg (4 mg Intravenous Given  03/20/24 1812)  iohexol  (OMNIPAQUE ) 350 MG/ML injection 75 mL (75 mLs Intravenous Contrast Given 03/20/24 1834)   ED Course  Patient seen and examined. History obtained directly from patient.   Labs/EKG: Ordered CBC, CMP, lipase, UA and pregnancy.  Lactate and blood culture ordered in triage.  Lactate was 0.7.  White blood cell count is elevated at 18,800..  Imaging: Ordered CT abdomen pelvis, discussed with patient that if she has any pelvic abnormalities, she may need follow-up ultrasound.  Medications/Fluids: Ordered: Hydromorphone  0.5 mg, Zofran  4 mg, LR bolus 1000 cc.  Most recent vital signs reviewed and are as follows: BP (!) 150/94   Pulse (!) 136   Temp 100 F (37.8 C) (Oral)   Resp 20   Ht 5' 3 (1.6 m)   Wt 113 kg   SpO2 100%   BMI 44.13 kg/m  Initial impression: Left-sided abdominal pain, tachycardia, elevated white blood cell count.  CT pending.  7:48 PM Reassessment performed. Patient appears still having pain but more comfortable.  Labs personally reviewed and interpreted including: CBC with elevated white blood cell count at 18.8, normal hemoglobin; CMP with bicarb 19, glucose 111 otherwise unremarkable; lactate was normal x 2; lipase was normal.  UA does have positive nitrite and white blood cells, however not clean-catch with 21-50 epithelial cells.  Imaging personally visualized and interpreted including:  Reviewed pertinent lab work and imaging with patient at bedside. Questions answered.   Most current vital signs reviewed and are as follows: BP 132/60 (BP Location: Right Arm)   Pulse 100   Temp 100 F (37.8 C) (Oral)   Resp 18   Ht 5' 3 (1.6 m)   Wt 113 kg   SpO2 100%   BMI 44.13 kg/m   Plan: Discharge to home after additional pain medication and oral antibiotics.  Prescriptions written for: Oxycodone  # 6 tablets, Augmentin   Other home care instructions discussed: Bland diet, rest, maintain good hydration  ED return instructions discussed:  The patient was urged to return to the Emergency Department immediately with worsening of current symptoms, worsening abdominal pain, persistent vomiting, blood noted in stools, fever, or any other concerns. The patient verbalized understanding.   Follow-up instructions discussed: Patient encouraged to follow-up with their PCP in 5 days.  Also given information for outpatient GI follow-up.  Discussed with patient that she may require further evaluation with colonoscopy due to first episode of diverticulitis.                                   Medical Decision Making Amount and/or Complexity of Data Reviewed Labs: ordered. Radiology: ordered.  Risk OTC drugs. Prescription drug management.   For this patient's complaint of abdominal pain, the following conditions were considered on the differential diagnosis: gastritis/PUD, enteritis/duodenitis, appendicitis, cholelithiasis/cholecystitis, cholangitis, pancreatitis, ruptured viscus, colitis, diverticulitis, small/large bowel obstruction, proctitis, cystitis, pyelonephritis, ureteral colic, aortic dissection, aortic aneurysm. In women, ectopic pregnancy, pelvic inflammatory disease, ovarian cysts, and tubo-ovarian abscess were also considered. Atypical chest etiologies were also considered including ACS, PE, and pneumonia.  Patient evaluation with elevated white blood cell count.  CT scan shows acute uncomplicated diverticulitis without abscess or perforation.  Pain improved with treatment in the ED.  Patient tolerating oral fluids and antibiotic.  Do not feel that she requires admission to the hospital at this time.  She will need outpatient PCP and GI follow-up.  The patient's vital signs, pertinent lab work and imaging were reviewed and interpreted as discussed in the ED course. Hospitalization was considered for further testing, treatments, or serial exams/observation. However as patient is well-appearing, has a stable exam, and reassuring  studies today, I do not feel that they warrant admission at this time. This plan was discussed with the patient who verbalizes agreement and comfort with this plan and seems reliable and able to return to the Emergency Department with worsening or changing symptoms.        Final diagnoses:  Acute diverticulitis    ED Discharge Orders          Ordered    amoxicillin -clavulanate (AUGMENTIN ) 875-125 MG tablet  Every 12 hours        03/20/24 2055    oxyCODONE  (OXY IR/ROXICODONE ) 5 MG immediate release tablet  Every 6 hours PRN  03/20/24 2055               Desiderio Chew, PA-C 03/20/24 2121    Dean Clarity, MD 03/20/24 2125

## 2024-03-20 NOTE — Discharge Instructions (Signed)
 Please read and follow all provided instructions.  Your diagnoses today include:  1. Acute diverticulitis     Tests performed today include: Complete blood cell count: Your white blood cell count was high Complete metabolic panel: No major problems, blood sugar was slightly high Lipase (pancreas function test): Was normal Urinalysis (urine test): Doubt urinary tract infection Pregnancy test (urine or blood, in women only): Was negative Lactate: screening test for sepsis was normal Vital signs. See below for your results today.   Medications prescribed:  Augmentin  - antibiotic  You have been prescribed an antibiotic medicine: take the entire course of medicine even if you are feeling better. Stopping early can cause the antibiotic not to work.  Oxycodone  - narcotic pain medication for severe pain  DO NOT drive or perform any activities that require you to be awake and alert because this medicine can make you drowsy.   Take any prescribed medications only as directed.  Home care instructions:  Follow any educational materials contained in this packet.  Follow-up instructions: Please follow-up with your primary care provider in the next 5 days for further evaluation of your symptoms, to make sure they are getting better appropriately.  Return instructions:  SEEK IMMEDIATE MEDICAL ATTENTION IF: The pain does not go away or becomes severe  A temperature above 101F develops  Repeated vomiting occurs (multiple episodes)  The pain becomes localized to portions of the abdomen. The right side could possibly be appendicitis. In an adult, the left lower portion of the abdomen could be colitis or diverticulitis.  Blood is being passed in stools or vomit (bright red or black tarry stools)  You develop chest pain, difficulty breathing, dizziness or fainting, or become confused, poorly responsive, or inconsolable (young children) If you have any other emergent concerns regarding your  health  Additional Information: Abdominal (belly) pain can be caused by many things. Your caregiver performed an examination and possibly ordered blood/urine tests and imaging (CT scan, x-rays, ultrasound). Many cases can be observed and treated at home after initial evaluation in the emergency department. Even though you are being discharged home, abdominal pain can be unpredictable. Therefore, you need a repeated exam if your pain does not resolve, returns, or worsens. Most patients with abdominal pain don't have to be admitted to the hospital or have surgery, but serious problems like appendicitis and gallbladder attacks can start out as nonspecific pain. Many abdominal conditions cannot be diagnosed in one visit, so follow-up evaluations are very important.  Your vital signs today were: BP (!) 148/87   Pulse 95   Temp 100 F (37.8 C) (Oral)   Resp 20   Ht 5' 3 (1.6 m)   Wt 113 kg   SpO2 97%   BMI 44.13 kg/m  If your blood pressure (bp) was elevated above 135/85 this visit, please have this repeated by your doctor within one month. --------------

## 2024-03-20 NOTE — ED Triage Notes (Signed)
 Pt reports lower left sided pelvic pain since yesterday that worsened around 230. Hx of PCOS. Pt took miralax and able to go some but pain has continued. Pain shoots down to vagina when urinating.

## 2024-03-20 NOTE — ED Notes (Signed)
 Extra one set cultures, SST & Blue tube drawn

## 2024-03-20 NOTE — ED Triage Notes (Signed)
 Patient reports L lower abdominal pain since yesterday, started as mild pain/cramping but continues to increase and cause more and more discomfort. Patient has hx of ovarian cysts and reports normal bowel movements.

## 2024-03-20 NOTE — ED Notes (Signed)
 Pt provided with discharge and follow up instructions, medications discussed, pt verbalized understanding. LDA removed, VSS, pt ambulatory out of ED w/ all paperwork and belongings in NAD.
